# Patient Record
Sex: Female | Born: 1985 | Race: Black or African American | Hispanic: No | Smoking: Never smoker
Health system: Southern US, Community
[De-identification: ages and names within clinical notes are randomized; demographics above are authoritative.]

## PROBLEM LIST (undated history)

## (undated) HISTORY — PX: HERNIA REPAIR: SHX51

## (undated) HISTORY — PX: TUBAL LIGATION: SHX77

---
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
## 2007-11-28 ENCOUNTER — Emergency Department: Payer: Self-pay | Admitting: Emergency Medicine

## 2008-07-13 ENCOUNTER — Emergency Department: Payer: Self-pay | Admitting: Emergency Medicine

## 2010-02-08 ENCOUNTER — Emergency Department: Payer: Self-pay | Admitting: Emergency Medicine

## 2010-05-11 ENCOUNTER — Observation Stay: Payer: Self-pay

## 2010-06-06 ENCOUNTER — Observation Stay: Payer: Self-pay

## 2010-06-13 ENCOUNTER — Observation Stay: Payer: Self-pay | Admitting: Obstetrics and Gynecology

## 2010-06-20 ENCOUNTER — Observation Stay: Payer: Self-pay

## 2010-06-27 ENCOUNTER — Observation Stay: Payer: Self-pay | Admitting: Advanced Practice Midwife

## 2010-07-05 ENCOUNTER — Observation Stay: Payer: Self-pay | Admitting: Obstetrics and Gynecology

## 2010-07-07 ENCOUNTER — Inpatient Hospital Stay: Payer: Self-pay

## 2010-09-26 ENCOUNTER — Emergency Department: Payer: Self-pay | Admitting: Emergency Medicine

## 2011-12-17 LAB — HM PAP SMEAR: HM Pap smear: NEGATIVE

## 2012-07-01 ENCOUNTER — Inpatient Hospital Stay: Payer: Self-pay | Admitting: Obstetrics and Gynecology

## 2012-07-01 LAB — CBC WITH DIFFERENTIAL/PLATELET
Basophil #: 0 10*3/uL (ref 0.0–0.1)
Basophil %: 0.6 %
Eosinophil %: 0 %
HGB: 9.8 g/dL — ABNORMAL LOW (ref 12.0–16.0)
Lymphocyte #: 1.4 10*3/uL (ref 1.0–3.6)
Lymphocyte %: 18.2 %
Monocyte #: 0.4 x10 3/mm (ref 0.2–0.9)
Neutrophil #: 5.7 10*3/uL (ref 1.4–6.5)
Neutrophil %: 75.6 %
WBC: 7.5 10*3/uL (ref 3.6–11.0)

## 2012-07-01 LAB — COMPREHENSIVE METABOLIC PANEL
Albumin: 2.7 g/dL — ABNORMAL LOW (ref 3.4–5.0)
Alkaline Phosphatase: 324 U/L — ABNORMAL HIGH (ref 50–136)
Bilirubin,Total: 0.5 mg/dL (ref 0.2–1.0)
Calcium, Total: 8.2 mg/dL — ABNORMAL LOW (ref 8.5–10.1)
Co2: 26 mmol/L (ref 21–32)
EGFR (African American): >60
EGFR (Non-African Amer.): >60
Glucose: 56 mg/dL — ABNORMAL LOW (ref 65–99)
Potassium: 3.4 mmol/L — ABNORMAL LOW (ref 3.5–5.1)
SGOT(AST): 17 U/L (ref 15–37)
SGPT (ALT): 7 U/L — ABNORMAL LOW (ref 12–78)
Sodium: 138 mmol/L (ref 136–145)
Total Protein: 6.4 g/dL (ref 6.4–8.2)

## 2012-07-01 LAB — DRUG SCREEN, URINE
Barbiturates, Ur Screen: NEGATIVE (ref ?–200)
Cannabinoid 50 Ng, Ur ~~LOC~~: POSITIVE (ref ?–50)
Methadone, Ur Screen: NEGATIVE (ref ?–300)
Opiate, Ur Screen: NEGATIVE (ref ?–300)
Phencyclidine (PCP) Ur S: NEGATIVE (ref ?–25)

## 2014-01-07 ENCOUNTER — Observation Stay: Payer: Self-pay | Admitting: Obstetrics and Gynecology

## 2014-01-07 LAB — URINALYSIS, COMPLETE
Bacteria: NONE SEEN
Bilirubin,UR: NEGATIVE
Blood: NEGATIVE
GLUCOSE, UR: NEGATIVE mg/dL (ref 0–75)
Ketone: NEGATIVE
Leukocyte Esterase: NEGATIVE
NITRITE: NEGATIVE
Ph: 7 (ref 4.5–8.0)
Protein: NEGATIVE
RBC,UR: <1 /HPF (ref 0–5)
SPECIFIC GRAVITY: 1.005 (ref 1.003–1.030)
Squamous Epithelial: 7
WBC UR: NONE SEEN /HPF (ref 0–5)

## 2014-01-07 LAB — DRUG SCREEN, URINE
Amphetamines, Ur Screen: NEGATIVE (ref ?–1000)
BARBITURATES, UR SCREEN: NEGATIVE (ref ?–200)
Benzodiazepine, Ur Scrn: NEGATIVE (ref ?–200)
COCAINE METABOLITE, UR ~~LOC~~: NEGATIVE (ref ?–300)
Cannabinoid 50 Ng, Ur ~~LOC~~: POSITIVE (ref ?–50)
MDMA (Ecstasy)Ur Screen: NEGATIVE (ref ?–500)
Methadone, Ur Screen: NEGATIVE (ref ?–300)
OPIATE, UR SCREEN: NEGATIVE (ref ?–300)
Phencyclidine (PCP) Ur S: NEGATIVE (ref ?–25)
TRICYCLIC, UR SCREEN: NEGATIVE (ref ?–1000)

## 2014-01-07 LAB — FETAL FIBRONECTIN
APPEARANCE: NORMAL
Fetal Fibronectin: NEGATIVE

## 2014-01-08 LAB — GC/CHLAMYDIA PROBE AMP

## 2014-01-15 ENCOUNTER — Observation Stay: Payer: Self-pay | Admitting: Obstetrics & Gynecology

## 2014-01-15 LAB — URINALYSIS, COMPLETE
BILIRUBIN, UR: NEGATIVE
BLOOD: NEGATIVE
Bacteria: NONE SEEN
Glucose,UR: NEGATIVE mg/dL (ref 0–75)
Ketone: NEGATIVE
NITRITE: NEGATIVE
Ph: 6 (ref 4.5–8.0)
Protein: NEGATIVE
Specific Gravity: 1.017 (ref 1.003–1.030)
WBC UR: 7 /HPF (ref 0–5)

## 2014-03-12 ENCOUNTER — Ambulatory Visit
Admit: 2014-03-12 | Disposition: A | Discharge status: Home / Self Care | Payer: Self-pay | Admitting: Obstetrics & Gynecology

## 2014-03-24 ENCOUNTER — Inpatient Hospital Stay: Payer: Self-pay

## 2014-03-24 LAB — CBC WITH DIFFERENTIAL/PLATELET
BASOS PCT: 4.2 %
Basophil #: 0.3 10*3/uL — ABNORMAL HIGH (ref 0.0–0.1)
EOS PCT: 0.5 %
Eosinophil #: 0 10*3/uL (ref 0.0–0.7)
HCT: 30.3 % — ABNORMAL LOW (ref 35.0–47.0)
HGB: 9.5 g/dL — AB (ref 12.0–16.0)
LYMPHS ABS: 0.9 10*3/uL — AB (ref 1.0–3.6)
Lymphocyte %: 11.2 %
MCH: 29.6 pg (ref 26.0–34.0)
MCHC: 31.5 g/dL — ABNORMAL LOW (ref 32.0–36.0)
MCV: 94 fL (ref 80–100)
MONO ABS: 0.4 x10 3/mm (ref 0.2–0.9)
Monocyte %: 5.4 %
NEUTROS ABS: 6 10*3/uL (ref 1.4–6.5)
NEUTROS PCT: 78.7 %
Platelet: 453 10*3/uL — ABNORMAL HIGH (ref 150–440)
RBC: 3.22 10*6/uL — ABNORMAL LOW (ref 3.80–5.20)
RDW: 21.8 % — ABNORMAL HIGH (ref 11.5–14.5)
WBC: 7.6 10*3/uL (ref 3.6–11.0)

## 2014-03-25 LAB — HEMATOCRIT: HCT: 28.7 % — AB (ref 35.0–47.0)

## 2014-07-02 NOTE — Op Note (Signed)
PATIENT NAME:  Rachel Stevenson, Rachel Stevenson MR#:  [id number] DATE OF BIRTH:  03/14/1985  DATE OF PROCEDURE:  07/01/2012  PREOPERATIVE DIAGNOSES: 1.  Term intrauterine pregnancy at 37 weeks 4 days gestation.  2.  Laboring with fetus in breech position.  POSTOPERATIVE DIAGNOSES: 1.  Term intrauterine pregnancy at 37 weeks 4 days gestation.  2.  Laboring with fetus in breech position.   OPERATION PERFORMED:  Primary low transverse cesarean section via Pfannenstiel skin incision.   ANESTHESIA USED:  Spinal.   PRIMARY SURGEON:  Vena Austria, M.D.  ASSISTANT:  Burgess Estelle, MD  ESTIMATED BLOOD LOSS:  600 mL.   DRAINS OR TUBES:  Foley to gravity drainage, On-Q catheter system.   PREOPERATIVE ANTIBIOTICS:  2 grams of Ancef.   COMPLICATIONS:  None.   SPECIMENS REMOVED:  None.   INTRAOPERATIVE FINDINGS:  Normal uterus, tubes, and ovaries.  Fetus in the footling breech position.  Birth weight 2500 grams, Apgars 7 and 8.   THE PATIENT'S CONDITION FOLLOWING PROCEDURE:  Stable.   PROCEDURE IN DETAIL:  Risks, benefits, and alternatives of the procedure were discussed with patient prior to proceeding to the operating room.  The attempts were made to halt the patient's labor with IV hydration and terbutaline.  The patient made change from 2 to 4 cm during her stay on labor and delivery.  A mutual decision was made to proceed with operative delivery.  The patient was taken to the operating room where spinal anesthesia was administered.  The patient was positioned in the supine position, prepped and draped in the usual sterile fashion.  Prior to beginning of the case timeout was performed and local anesthetic was checked and noted to be adequate.  A Pfannenstiel skin incision was made 2 cm above the pubic symphysis, carried down sharply to the level of the rectus fascia using the knife and the fascia was incised in the midline using the knife.  The fascial incision was then extended using Mayo  scissors.  The superior border of the rectus fascia was grasped with 2 Coker clamps.  The underlying rectus muscles were then dissected off the fascia bluntly.  The median raphe was incised using Mayo scissors.  The inferior border of the rectus fascia was dissected off the muscle in a similar fashion.  The midline was identified.  The peritoneum was entered bluntly.  The peritoneal incision was then extended using manual traction.  A bladder blade was placed and a bladder flap was created using Metzenbaum scissors and developed digitally.  The bladder blade was then replaced displacing the bladder caudad.  A low transverse incision was then made on the uterus and the hysterotomy was entered bluntly using the operator's finger and then extended using manual traction.  Upon placing the operator's hand into the incision, the fetus was noted to be in the footling breech position.  Both feet were identified, grasped, brought to the incision, and delivered atraumatically.  The fetus was then delivered to the level of the scapula.  The right arm was braced and then delivered the fetus was rotated 180 degrees and the left arm was delivered in a similar fashion.  The head was then flexed using a Mauriceau-Smellie-Veit maneuver and delivered atraumatically using fundal pressure.  The infant was suctioned, cord was clamped and cut and the infant was passed to the awaiting pediatrician.  Cord blood was obtained.  The placenta was delivered using manual extraction.  The uterus was then exteriorized, wiped clean of clots and debris.  The hysterotomy was repaired using a two layer closure of 0 Vicryl with the first being a running locked, the second a vertical imbricating.  The uterus returned to the abdomen.  The peritoneal gutters were wiped clean of clots and debris using two moist laps.  The hysterotomy incision was reinspected and noted to be hemostatic.  The On-Q catheter system was then placed subfascially per the usual  protocol.  The fascia was closed using a looped #1 PDS in a running fashion.  Following fascial closure, the subcutaneous tissue was irrigated.  Hemostasis was achieved using the Bovie.  Skin was closed using Insorb staples.  Each On-Q catheter was then bolused with 5 mL of 0.5% bupivacaine each and dressed with Dermabond.  Sponge, needle, and instrument counts were correct x 2.  The patient was taken to the recovery room in stable condition.      ____________________________ Florina OuAndreas M. Bonney AidStaebler, MD ams:ea D: 07/02/2012 01:30:26 ET T: 07/02/2012 02:48:20 ET JOB#: [id number]  cc: Florina OuAndreas M. Bonney AidStaebler, MD, <Dictator> Lorrene ReidANDREAS M Messi Twedt MD ELECTRONICALLY SIGNED 07/08/2012 14:18

## 2014-07-05 LAB — SURGICAL PATHOLOGY

## 2014-07-11 NOTE — Op Note (Signed)
PATIENT NAME:  Rachel Stevenson, Rachel Stevenson MR#:  [id number] DATE OF BIRTH:  1986/02/28  DATE OF PROCEDURE:  03/24/2014  PREOPERATIVE DIAGNOSES:  1. Intrauterine pregnancy at 35 weeks 6 days gestational age.  2. Preterm labor.  3. Breech presentation.  4. Desire for permanent sterility.  5. History of prior cesarean section, desires repeat.   POSTOPERATIVE DIAGNOSES: 1. Intrauterine pregnancy at 35 weeks 6 days gestational age.  2. Preterm labor.  3. Breech presentation.  4. Desire for permanent sterility.  5. History of prior cesarean section, desires repeat.   PROCEDURE: 1. Repeat low transverse cesarean section via Pfannenstiel incision.  2. Bilateral tubal ligation via Pomeroy method.   ANESTHESIA: Spinal.   SURGEON: Conard Novak, MD.  ASSISTANT SURGEON:  Jannet Mantis, CNM.  ESTIMATED BLOOD LOSS: 850 mL.   OPERATIVE FLUIDS: 900 mL.   COMPLICATIONS: None.   URINE OUTPUT: 100 mL of clear urine.   FINDINGS: Normal appearing gravid uterus, fallopian tubes and ovaries.   SPECIMENS: Portion of right and left fallopian tubes.   CONDITION AT THE END OF THE PROCEDURE:  Stable.   PROCEDURE IN DETAIL: The patient was taken to the Operating Room where regional anesthesia was administered and was found to be adequate. The patient was placed in the dorsal supine position with a leftward tilt and prepped and draped in the usual sterile fashion.   A timeout was called.   A Pfannenstiel incision was made with a scalpel and carried through the various layers until the peritoneum was identified and entered sharply. The peritoneal opening was extended and a bladder flap was created and a bladder retractor was used to pull the bladder out of the operative area of interest. A low transverse hysterotomy was made with a scalpel and extended laterally with cranial and caudal tension. The fetal breech was grasped and delivered via the Mauriceau-Smellie-Veit technique using a moistened blue towel  on the lower back area. The cord was clamped and cut and the infant was handed off to the pediatrician. Cord blood was collected. The placenta delivered spontaneously intact with a 3 vessel cord. The uterus was cleared of all clots and debris after being exteriorized. The hysterotomy was closed using #0 Vicryl in a running locked fashion. Hemostasis was noted at this point.   Attention was turned to the left fallopian tube where in the mid isthmic region an approximately 3 cm portion of fallopian tube was suture ligated using two #0 plain guts using the Pomeroy method after verification that this indeed, was a tube by following the tube out to the fimbriated end. After removal of a segment of tube, hemostasis was verified. The same procedure was carried out on the right fallopian tube and hemostasis was again noted. The hysterotomy was then reinspected with the uterus exteriorized and found to be hemostatic. The uterus was returned to the abdomen and the abdomen was cleared of all clots and debris. The hysterotomy was again checked and verified to be hemostatic. The peritoneum was reapproximated using #0 Vicryl in a running fashion.   The On-Q catheters were placed according to the manufacturer's recommendations. They were inserted approximately 4 cm cephalad to the incision line, inserted to a position superficial to the rectus abdominis muscles and deep to the rectus fascia. They were inserted to a depth of approximately the third marking on the catheters.   The fascia was closed using #0 Vicryl in a running fashion using 2 sutures each starting at the lateral apices and meeting in  the middle where they were tied together. The subcutaneous tissue was reapproximated just below the dermal layer to relieve tension on the skin closure. The skin was closed using 4-0 Monocryl in a subcuticular fashion. The skin closure was reinforced using benzoin and Steri-Strips.   The On-Q catheters were affixed to the skin  using Dermabond as well as Steri-Strips and Tegaderm. Each catheter was bolused with 5 mL of 0.5% Marcaine plain for a total of 10 mL.    The patient tolerated the procedure well. Sponge, lap and needle counts were correct x2. The patient received Ancef 2 grams prior to skin incision. The patient was taken to the recovery room in stable condition.    ____________________________ Conard NovakStephen D. Kynlea Blackston, MD sdj:by D: 03/24/2014 20:27:23 ET T: 03/24/2014 21:07:37 ET JOB#: [id number]  cc: Conard NovakStephen D. Ahtziri Jeffries, MD, <Dictator> Conard NovakSTEPHEN D Maudean Hoffmann MD ELECTRONICALLY SIGNED 03/25/2014 13:14

## 2014-07-20 NOTE — H&P (Signed)
L&D Evaluation:  History:  HPI 37 yer old G2 P1001 with EDC=07/22/2012 by 10wk 2 day ultrasound presents with c/o nausea/vomiting x 1 week with "blood" in vomitus and onset contractions last night. Has had vomiting throughout pregnancy and blood in vomitus but has gained 30# with pregnancy. Also c/o headache this AM and substrenal burning sensation. Denies use of any medications for nausea or heartburn. Last ate at 9 PM. Able to keep down juice on the way to hospital.  Contractions that started last nite became more frequent and regular on arrival to L&D. Denies LOF or VB. Denies nasal congestion. Baby active. Hx of SVD in 2012 of a 5#9oz female.   Presents with contractions, nausea/vomiting   Patient's Medical History abnormal Paps   Patient's Surgical History colpo 2008   Medications Pre Natal Vitamins   Allergies NKDA   Social History denies use of tobacco, alcohol, or illicit street drugs   Family History Non-Contributory   Exam:  Vital Signs stable   Urine Protein not completed   General no apparent distress   Mental Status clear   Chest clear   Heart normal sinus rhythm, no murmur/gallop/rubs   Abdomen gravid, tender with contractions   Estimated Fetal Weight Small for gestational age   Fetal Position incomplete breech   Edema no edema   Reflexes 1+   Pelvic 2-3/75%/OOP   Mebranes Intact, AFI=4.28 + 6.03 + 4.3 + 2.2 cm=16.81 cm.  Placenta fundal   FHT normal rate with no decels, Cat 1   Ucx q2-3 min   Skin dry   Impression:  Impression IUP at 37 weeks with hematochezia and contractions.  Breech presentation. R/O labor   Plan:  Plan EFM/NST, monitor contractions and for cervical change, IV bolus, IV phenergan, and IV Protonix. NPO except ice chips. Met c, UDS with routine intrapartum labs. Discussed with patient  breech presentation and increased risk to baby with vaginal delivery  of head entrapment.  Explined that she would need a C-section to deliver  baby if labor progresses. Dr Georgianne Fick notified of patient and has talked with patient.   Electronic Signatures: Rachel Stevenson (CNM)  (Signed 22-Apr-14 12:20)  Authored: L&D Evaluation   Last Updated: 22-Apr-14 12:20 by Rachel Stevenson (CNM)
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
## 2016-03-08 IMAGING — US US OB US >=[ID] SNGL FETUS
1 series · 14 of 28 positions shown · non-contrast
Comparison: none

CLINICAL DATA: Pregnancy evaluation.

EXAM:
ULTRASOUND OB >=EO7FN SINGLE FETUS

[Series 1: us ob us >=(id) sngl fetus · 0.18mm/px · 14 of 81 slices shown]
[im 3/81]
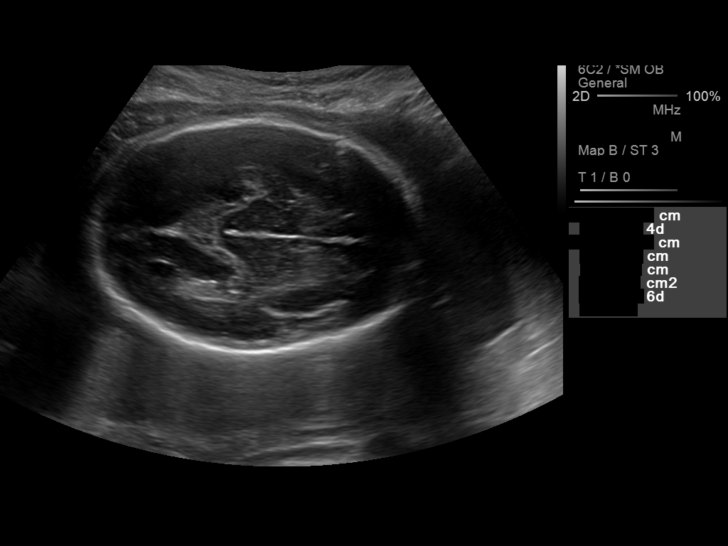
[im 9/81]
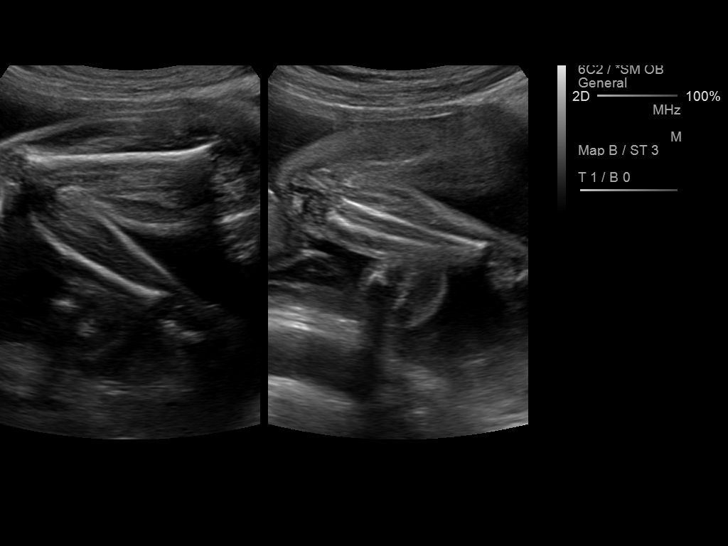
[im 15/81]
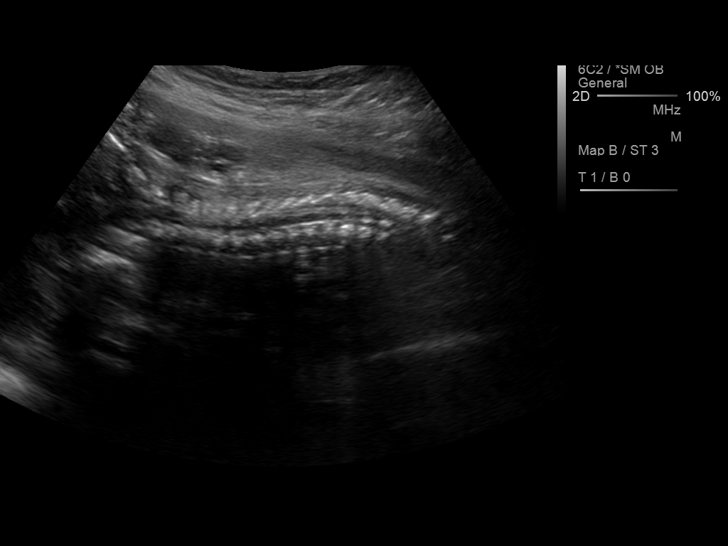
[im 21/81]
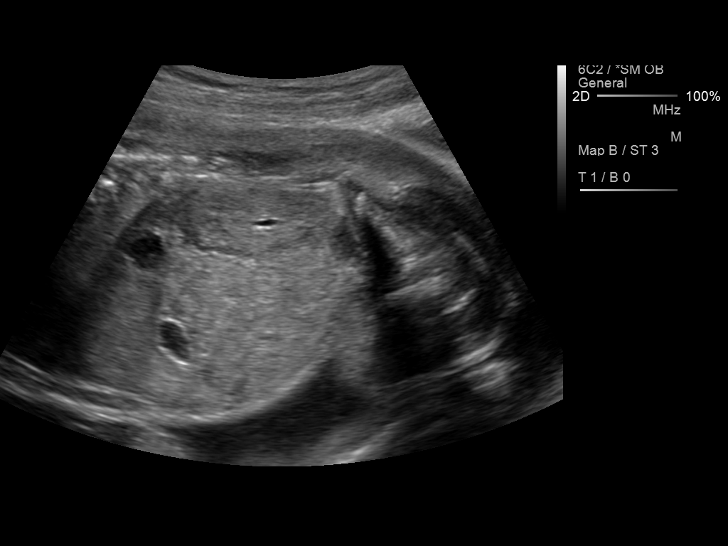
[im 27/81]
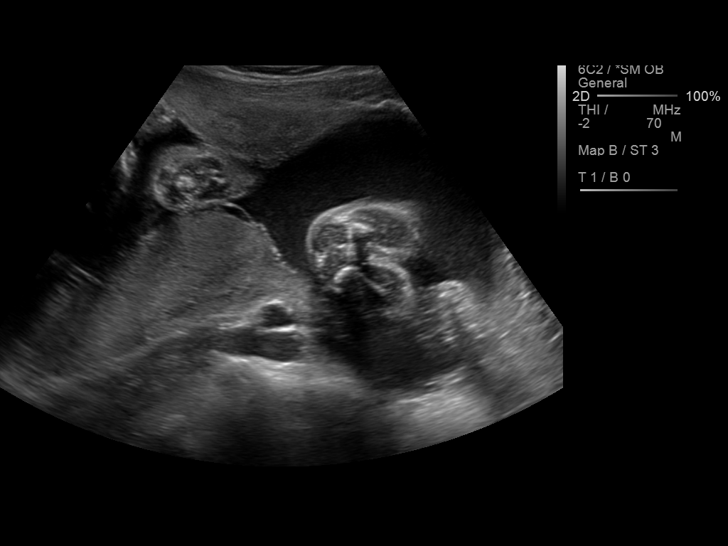
[im 33/81]
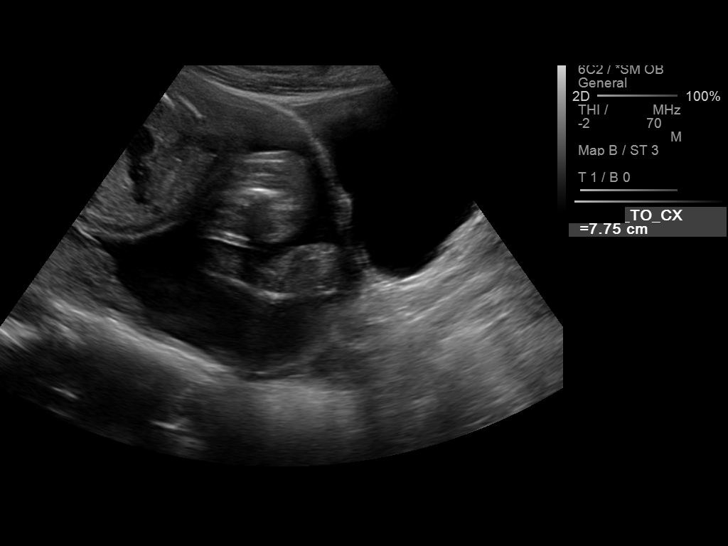
[im 39/81]
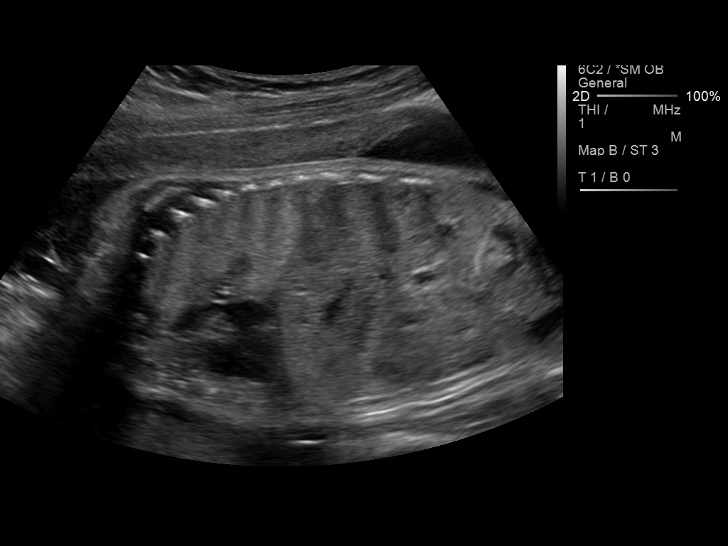
[im 45/81]
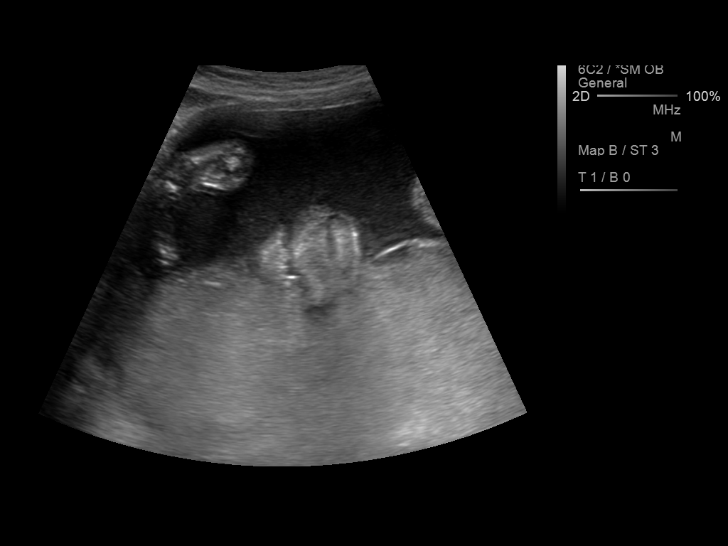
[im 51/81]
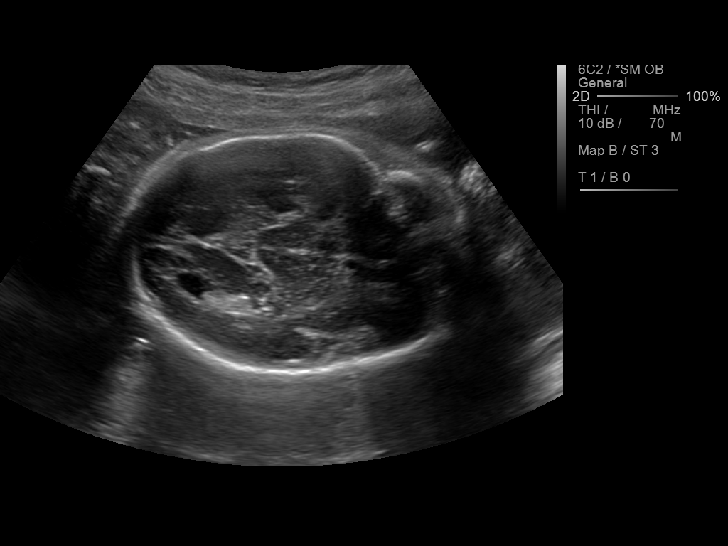
[im 57/81]
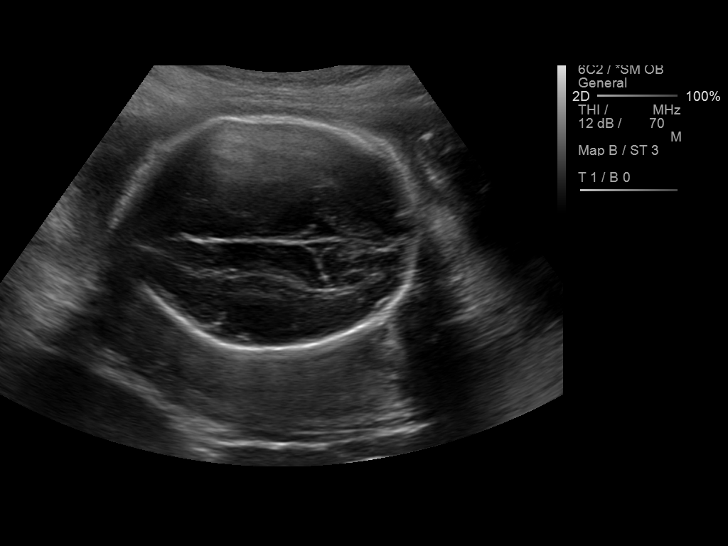
[im 63/81]
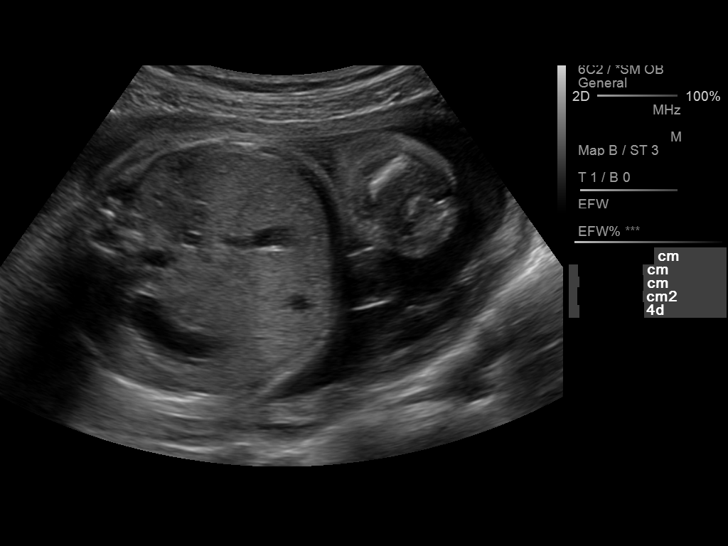
[im 69/81]
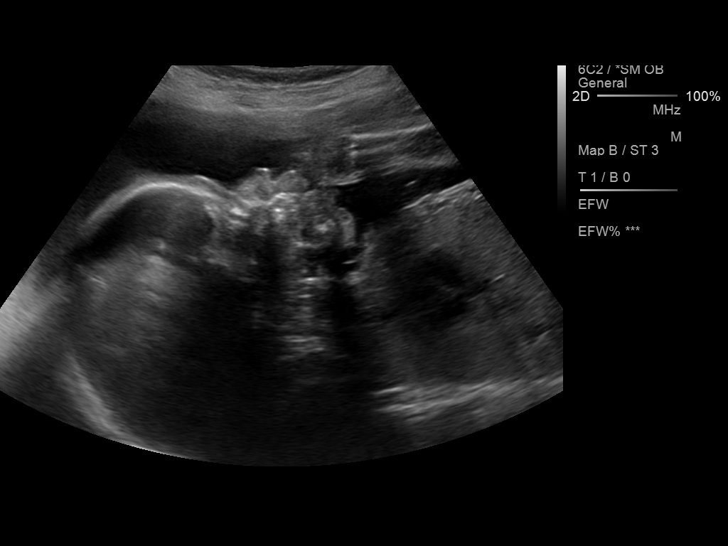
[im 75/81]
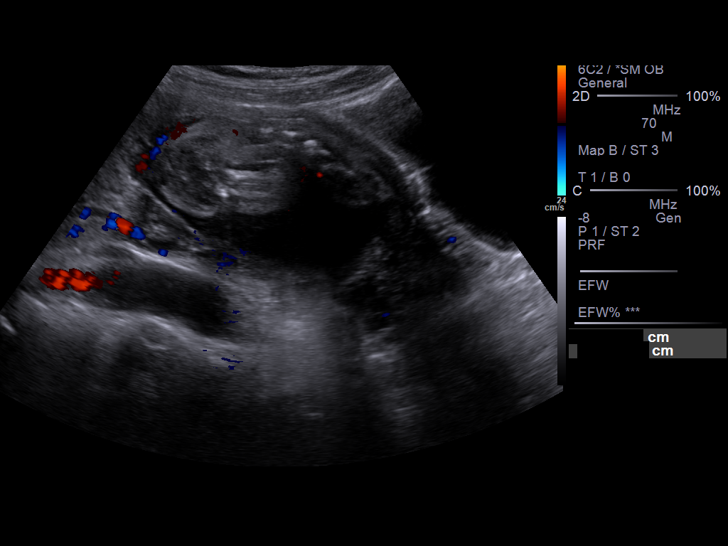
[im 81/81]
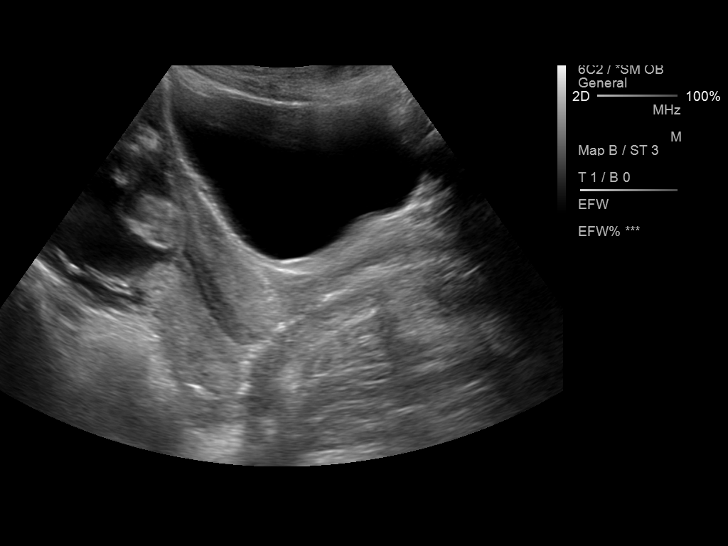

[14 of 28 positions shown; findings below may reference images not displayed]

FINDINGS: Number of Fetuses: 1

Heart Rate:  143 bpm

Movement: Present.

Presentation: Breech

Previa: No

Placental Location: Posterior

Amniotic Fluid (Subjective): Normal

Amniotic Fluid (Objective):

AFI 14.2 cm

FETAL BIOMETRY

BPD:  6.1cm 24w 5d

HC:    22.6cm  24w   5d

AC:   20.5cm  25w   1d

FL:   4.8cm  25w   6d

Current Mean GA: 25w 0d

FETAL ANATOMY

Fetal anatomy is best evaluated at a tertiary care or high risk
Center, particularly if the patient has risk factors for this
pregnancy.

Lateral Ventricles: Visualized.

Thalami/CSP: Visualized.

Posterior Fossa:  Visualized.

Nuchal Region: Not visualized.

Spine: Visualized.

Upper Lip: Visualized.

4 Chamber Heart on Left: Visualized.

Stomach on Left: Visualized.

3 Vessel Cord: Visualized.

Cord Insertion site: Visualized.

Kidneys: Visualized.

Bladder: Visualized.

Extremities: Visualized.

Maternal Findings:

Cervix:  3.2  cm  closed.
IMPRESSION: Single viable intrauterine pregnancy at 25 weeks 0 days. Fetus is in
breech presentation.

## 2016-04-05 DIAGNOSIS — B977 Papillomavirus as the cause of diseases classified elsewhere: Secondary | ICD-10-CM | POA: Insufficient documentation

## 2016-07-17 LAB — HM HIV SCREENING LAB: HM HIV Screening: NEGATIVE

## 2016-11-01 ENCOUNTER — Emergency Department
Admission: EM | Admit: 2016-11-01 | Discharge: 2016-11-01 | Disposition: A | Discharge status: Home / Self Care | Payer: Self-pay | Attending: Emergency Medicine | Admitting: Emergency Medicine

## 2016-11-01 DIAGNOSIS — W503XXA Accidental bite by another person, initial encounter: Secondary | ICD-10-CM | POA: Insufficient documentation

## 2016-11-01 DIAGNOSIS — Y9383 Activity, rough housing and horseplay: Secondary | ICD-10-CM | POA: Insufficient documentation

## 2016-11-01 DIAGNOSIS — S61259A Open bite of unspecified finger without damage to nail, initial encounter: Secondary | ICD-10-CM

## 2016-11-01 DIAGNOSIS — Y929 Unspecified place or not applicable: Secondary | ICD-10-CM | POA: Insufficient documentation

## 2016-11-01 DIAGNOSIS — Z23 Encounter for immunization: Secondary | ICD-10-CM | POA: Insufficient documentation

## 2016-11-01 DIAGNOSIS — Y999 Unspecified external cause status: Secondary | ICD-10-CM | POA: Insufficient documentation

## 2016-11-01 DIAGNOSIS — S61256A Open bite of right little finger without damage to nail, initial encounter: Secondary | ICD-10-CM | POA: Insufficient documentation

## 2016-11-01 MED ORDER — AMOXICILLIN-POT CLAVULANATE 875-125 MG PO TABS
1.0000 | ORAL_TABLET | Freq: Once | ORAL | Status: AC
  Administered 2016-11-01: 1 via ORAL
  Filled 2016-11-01: qty 1

## 2016-11-01 MED ORDER — TRAMADOL HCL 50 MG PO TABS: 50.0000 mg | ORAL_TABLET | Freq: Four times a day (QID) | ORAL | 0 refills | Status: AC | PRN

## 2016-11-01 MED ORDER — TETANUS-DIPHTHERIA TOXOIDS TD 5-2 LFU IM INJ
0.5000 mL | INJECTION | Freq: Once | INTRAMUSCULAR | Status: AC
  Administered 2016-11-01: 0.5 mL via INTRAMUSCULAR
  Filled 2016-11-01: qty 0.5

## 2016-11-01 MED ORDER — IBUPROFEN 600 MG PO TABS
600.0000 mg | ORAL_TABLET | Freq: Once | ORAL | Status: AC
  Administered 2016-11-01: 600 mg via ORAL
  Filled 2016-11-01: qty 1

## 2016-11-01 MED ORDER — IBUPROFEN 600 MG PO TABS: 600.0000 mg | ORAL_TABLET | Freq: Four times a day (QID) | ORAL | 0 refills | Status: DC | PRN

## 2016-11-01 MED ORDER — TRAMADOL HCL 50 MG PO TABS
50.0000 mg | ORAL_TABLET | Freq: Once | ORAL | Status: AC
  Administered 2016-11-01: 50 mg via ORAL
  Filled 2016-11-01: qty 1

## 2016-11-01 MED ORDER — AMOXICILLIN-POT CLAVULANATE 875-125 MG PO TABS: 1.0000 | ORAL_TABLET | Freq: Two times a day (BID) | ORAL | 0 refills | Status: DC
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
## 2016-11-01 NOTE — Discharge Instructions (Signed)
Final discharge care instruction take medication as directed.
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
## 2018-04-03 ENCOUNTER — Emergency Department
Admission: EM | Admit: 2018-04-03 | Discharge: 2018-04-03 | Disposition: A | Discharge status: Home / Self Care | Payer: Self-pay | Attending: Emergency Medicine | Admitting: Emergency Medicine

## 2018-04-03 ENCOUNTER — Emergency Department: Payer: Self-pay

## 2018-04-03 ENCOUNTER — Other Ambulatory Visit: Payer: Self-pay

## 2018-04-03 DIAGNOSIS — Y9389 Activity, other specified: Secondary | ICD-10-CM | POA: Insufficient documentation

## 2018-04-03 DIAGNOSIS — S93401A Sprain of unspecified ligament of right ankle, initial encounter: Secondary | ICD-10-CM | POA: Insufficient documentation

## 2018-04-03 DIAGNOSIS — Y999 Unspecified external cause status: Secondary | ICD-10-CM | POA: Insufficient documentation

## 2018-04-03 DIAGNOSIS — X500XXA Overexertion from strenuous movement or load, initial encounter: Secondary | ICD-10-CM | POA: Insufficient documentation

## 2018-04-03 DIAGNOSIS — Y929 Unspecified place or not applicable: Secondary | ICD-10-CM | POA: Insufficient documentation

## 2018-04-03 MED ORDER — NAPROXEN 500 MG PO TABS
500.0000 mg | ORAL_TABLET | Freq: Once | ORAL | Status: AC
  Administered 2018-04-03: 500 mg via ORAL
  Filled 2018-04-03: qty 1

## 2018-04-03 MED ORDER — NAPROXEN 500 MG PO TABS: 500.0000 mg | ORAL_TABLET | Freq: Two times a day (BID) | ORAL | Status: DC

## 2018-04-03 NOTE — ED Provider Notes (Signed)
[facility] Emergency Department Provider Note   ____________________________________________   First MD Initiated Contact with Patient 04/03/18 [phone]     (approximate)  I have reviewed the triage vital signs and the nursing notes.   HISTORY  Chief Complaint Ankle Pain    HPI Rachel Stevenson is a 33 y.o. female patient presents with right ankle pain secondary to sprain.  Patient fell and twisted her ankle prior to arrival.  Patient rates pain as a 9/10.  Patient unable to bear weight.  Patient described pain as "achy".  No palliative measures for complaint.  History reviewed. No pertinent past medical history.  There are no active problems to display for this patient.   Past Surgical History:  Procedure Laterality Date  . HERNIA REPAIR      Prior to Admission medications   Medication Sig Start Date End Date Taking? Authorizing Provider  amoxicillin-clavulanate (AUGMENTIN) 875-125 MG tablet Take 1 tablet by mouth 2 (two) times daily. 11/01/16   Joni ReiningSmith, Ronald K, PA-C  ibuprofen (ADVIL,MOTRIN) 600 MG tablet Take 1 tablet (600 mg total) by mouth every 6 (six) hours as needed. 11/01/16   Joni ReiningSmith, Ronald K, PA-C  naproxen (NAPROSYN) 500 MG tablet Take 1 tablet (500 mg total) by mouth 2 (two) times daily with a meal. 04/03/18   Joni ReiningSmith, Ronald K, PA-C    Allergies Patient has no known allergies.  No family history on file.  Social History Social History   Tobacco Use  . Smoking status: Never Smoker  . Smokeless tobacco: Never Used  Substance Use Topics  . Alcohol use: No  . Drug use: No    Review of Systems Constitutional: No fever/chills Eyes: No visual changes. ENT: No sore throat. Cardiovascular: Denies chest pain. Respiratory: Denies shortness of breath. Gastrointestinal: No abdominal pain.  No nausea, no vomiting.  No diarrhea.  No constipation. Genitourinary: Negative for dysuria. Musculoskeletal: Right ankle pain. Skin: Negative for  rash. Neurological: Negative for headaches, focal weakness or numbness.   ____________________________________________   PHYSICAL EXAM:  VITAL SIGNS: ED Triage Vitals  Enc Vitals Group     BP 04/03/18 0938 106/72     Pulse --      Resp 04/03/18 0938 15     Temp 04/03/18 0938 98.1 F (36.7 C)     Temp Source 04/03/18 0938 Oral     SpO2 04/03/18 0938 100 %     Weight 04/03/18 0938 125 lb (56.7 kg)     Height 04/03/18 0938 5\' 5"  (1.651 m)     Head Circumference --      Peak Flow --      Pain Score 04/03/18 0943 9     Pain Loc --      Pain Edu? --      Excl. in GC? --    Constitutional: Alert and oriented. Well appearing and in no acute distress. Hematological/Lymphatic/Immunilogical: No cervical lymphadenopathy. Cardiovascular: Normal rate, regular rhythm. Grossly normal heart sounds.  Good peripheral circulation. Respiratory: Normal respiratory effort.  No retractions. Lungs CTAB. Musculoskeletal: No lower extremity tenderness nor edema.  No joint effusions. Neurologic:  Normal speech and language. No gross focal neurologic deficits are appreciated. No gait instability. Skin:  Skin is warm, dry and intact. No rash noted. Psychiatric: Mood and affect are normal. Speech and behavior are normal.  ____________________________________________   LABS (all labs ordered are listed, but only abnormal results are displayed)  Labs Reviewed - No data to display ____________________________________________  EKG  ____________________________________________  RADIOLOGY  ED MD interpretation:    Official radiology report(s): Dg Ankle Complete Right  Result Date: 04/03/2018 CLINICAL DATA:  Pain following twisting injury EXAM: RIGHT ANKLE - COMPLETE 3+ VIEW COMPARISON:  None. FINDINGS: Frontal, oblique, and lateral views were obtained. There is mild soft tissue swelling laterally. There is no evident fracture. There is a focal joint effusion. There is no appreciable joint space  narrowing or erosion. The ankle mortise appears intact. IMPRESSION: Soft tissue swelling laterally with joint effusion. Suspect underlying ligamentous injury. No fracture evident. No appreciable arthropathy. Ankle mortise appears intact. Electronically Signed   By: Bretta BangWilliam  Woodruff III M.D.   On: 04/03/2018 10:17    ____________________________________________   PROCEDURES  Procedure(s) performed: None  Procedures  Critical Care performed: No  ____________________________________________   INITIAL IMPRESSION / ASSESSMENT AND PLAN / ED COURSE  As part of my medical decision making, I reviewed the following data within the electronic MEDICAL RECORD NUMBER    Patient presented running a pain secondary to a twisting fall.  Discussed x-ray findings with patient.  Patient placed in ankle splint given crutches assist with ambulation.  Patient given a work note advised follow-up PCP if no improvement in 3 to 4 days.      ____________________________________________   FINAL CLINICAL IMPRESSION(S) / ED DIAGNOSES  Final diagnoses:  Sprain of right ankle, unspecified ligament, initial encounter     ED Discharge Orders         Ordered    naproxen (NAPROSYN) 500 MG tablet  2 times daily with meals     04/03/18 1040           Note:  This document was prepared using Dragon voice recognition software and may include unintentional dictation errors.    Joni ReiningSmith, Ronald K, PA-C 04/03/18 1046    Rockne MenghiniNorman, Anne-Caroline, MD 04/03/18 1539

## 2018-04-03 NOTE — ED Triage Notes (Signed)
Pt c/o right ankle - she was outside and fell - pt reports that she is unable to bear any weight on the ankle

## 2018-04-03 NOTE — ED Notes (Signed)
See triage note  States she fell yesterday  Having pain to right ankle  States she was running and twisted ankle  Min swelling noted  Good pulses

## 2019-01-26 DIAGNOSIS — B977 Papillomavirus as the cause of diseases classified elsewhere: Secondary | ICD-10-CM

## 2019-01-27 ENCOUNTER — Other Ambulatory Visit: Payer: Self-pay

## 2019-01-27 ENCOUNTER — Encounter: Payer: Self-pay | Admitting: Physician Assistant

## 2019-01-27 ENCOUNTER — Ambulatory Visit: Payer: Medicaid Other | Admitting: Physician Assistant

## 2019-01-27 DIAGNOSIS — Z113 Encounter for screening for infections with a predominantly sexual mode of transmission: Secondary | ICD-10-CM | POA: Diagnosis not present

## 2019-01-27 DIAGNOSIS — N760 Acute vaginitis: Secondary | ICD-10-CM | POA: Diagnosis not present

## 2019-01-27 DIAGNOSIS — B9689 Other specified bacterial agents as the cause of diseases classified elsewhere: Secondary | ICD-10-CM

## 2019-01-27 LAB — WET PREP FOR TRICH, YEAST, CLUE
Trichomonas Exam: NEGATIVE
Yeast Exam: NEGATIVE

## 2019-01-27 MED ORDER — METRONIDAZOLE 500 MG PO TABS: 500.0000 mg | ORAL_TABLET | Freq: Two times a day (BID) | ORAL | 0 refills | Status: AC

## 2019-01-27 NOTE — Progress Notes (Signed)
    STI clinic/screening visit  Subjective:  Rachel Stevenson is a 33 y.o. female being seen today for an STI screening visit. The patient reports they do have symptoms.  Patient has the following medical conditions:   Patient Active Problem List   Diagnosis Date Noted  . 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 SEXUALLY TRANSMITTED DISEASE    HPI  Patient reports that she thinks that she may have yeast due to having lower back pains.  LMP 12/22/2018 and is s/p BTL.  Denies other symptoms.  See flowsheet for further details and programmatic requirements.    The following portions of the patient's history were reviewed and updated as appropriate: allergies, current medications, past medical history, past social history, past surgical history and problem list.  Objective:  There were no vitals filed for this visit.  Physical Exam Constitutional:      General: She is not in acute distress.    Appearance: Normal appearance.  HENT:     Head: Normocephalic and atraumatic.     Mouth/Throat:     Mouth: Mucous membranes are moist.     Pharynx: Oropharynx is clear. No oropharyngeal exudate or posterior oropharyngeal erythema.  Eyes:     Conjunctiva/sclera: Conjunctivae normal.  Pulmonary:     Effort: Pulmonary effort is normal.  Abdominal:     Palpations: Abdomen is soft. There is no mass.     Tenderness: There is no abdominal tenderness. There is no guarding or rebound.  Genitourinary:    General: Normal vulva.     Rectum: Normal.     Comments: External genitalia/pubic area without nits, lice, edema, erythema, lesions and inguinal adenopathy. Vagina with normal mucosa and moderate amount of pinkish/bloody  Discharge. Cervix without visible lesions. Uterus firm, mobile, nt, no masses, no CMT, no adnexal tenderness or fullness. Skin:    General: Skin is warm and dry.     Findings: No bruising, erythema, lesion or rash.  Neurological:      Mental Status: She is alert and oriented to person, place, and time.  Psychiatric:        Mood and Affect: Mood normal.        Behavior: Behavior normal.        Thought Content: Thought content normal.        Judgment: Judgment normal.       Assessment and Plan:  Rachel Stevenson is a 33 y.o. female presenting to the [facility] Department for STI screening  1. Screening for STD (sexually transmitted disease) Patient into clinic with symptoms. Rec condoms with all sex. Await test results.  Counseled that RN will call if needs to RTC for further treatment once results are back.  - WET PREP FOR [place], YEAST, CLUE - Chlamydia/Gonorrhea [facility] - HIV [place] [facility] - Syphilis Serology, [facility]  2. BV (bacterial vaginosis) Will treat with Metronidazole 500mg  #14 1 po BID for 7 days with food, no EtOH for 24 hr before and until 72 hr after completing medicine. No sex for 7days. Enc to use OTC antifungal cream if has itching during or just after completing antibiotic. - metroNIDAZOLE (FLAGYL) 500 MG tablet; Take 1 tablet (500 mg total) by mouth 2 (two) times daily for 7 days.  Dispense: 14 tablet; Refill: 0     No follow-ups on file.  No future appointments.  Jerene Dilling, PA

## 2019-05-17 NOTE — Progress Notes (Signed)
Chart reviewed by Pharmacist  Suzanne Walker PharmD, Contract Pharmacist at [facility]  

## 2019-08-04 ENCOUNTER — Encounter: Payer: Self-pay | Admitting: Emergency Medicine

## 2019-08-04 ENCOUNTER — Emergency Department
Admission: EM | Admit: 2019-08-04 | Discharge: 2019-08-04 | Disposition: A | Discharge status: Home / Self Care | Payer: Medicaid Other | Attending: Emergency Medicine | Admitting: Emergency Medicine

## 2019-08-04 ENCOUNTER — Other Ambulatory Visit: Payer: Self-pay

## 2019-08-04 DIAGNOSIS — K0889 Other specified disorders of teeth and supporting structures: Secondary | ICD-10-CM | POA: Diagnosis not present

## 2019-08-04 MED ORDER — OXYCODONE-ACETAMINOPHEN 5-325 MG PO TABS
1.0000 | ORAL_TABLET | Freq: Once | ORAL | Status: AC
  Administered 2019-08-04: 1 via ORAL
  Filled 2019-08-04: qty 1

## 2019-08-04 MED ORDER — LIDOCAINE VISCOUS HCL 2 % MT SOLN
15.0000 mL | Freq: Once | OROMUCOSAL | Status: AC
  Administered 2019-08-04: 15 mL via OROMUCOSAL
  Filled 2019-08-04: qty 15

## 2019-08-04 MED ORDER — IBUPROFEN 600 MG PO TABS
600.0000 mg | ORAL_TABLET | Freq: Once | ORAL | Status: AC
  Administered 2019-08-04: 600 mg via ORAL
  Filled 2019-08-04: qty 1

## 2019-08-04 NOTE — ED Triage Notes (Signed)
Pt reports pain to her right side wisdom teeth. Pt reports went to [facility] but was told to come to the ED.

## 2019-08-04 NOTE — Discharge Instructions (Signed)
Follow-up with you into see Ortho treating dentist of your choice for t tooth extraction tooth.

## 2019-08-04 NOTE — ED Provider Notes (Signed)
[facility] Emergency Department Provider Note   ____________________________________________   First MD Initiated Contact with Patient 08/04/19 1428     (approximate)  I have reviewed the triage vital signs and the nursing notes.   HISTORY  Chief Complaint Dental Pain    HPI Rachel Stevenson is a 34 y.o. female patient presents to the ED stating she was sent here for tooth extraction.  Patient states she left her oral surgical clinic just prior to her arrival here.  Patient shows a consult sheet test states please evaluate and extract tooth.  Advised patient we do not do teeth extractions at this facility.  Patient is very upset that we cannot pull the tooth.  I decided to call the dental clinic that issued the the initial consult and found out that the patient was supposed to go to [facility] dental clinic.    I told the patient I must misunderstood and will treat her pain.  Mother is with the patient and stated they have  obtain the name of another dentist so did not have to drive to [facility] but will go to [place], [place] dental clinic     History reviewed. No pertinent past medical history.  Patient Active Problem List   Diagnosis Date Noted  . 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 HERNIA REPAIR    . TUBAL LIGATION      Prior to Admission medications   Medication Sig Start Date End Date Taking? Authorizing Provider  cefdinir (OMNICEF) 300 MG capsule Take 300 mg by mouth 2 (two) times daily.   Yes [provider]  HYDROcodone-acetaminophen (NORCO/VICODIN) 5-325 MG tablet Take 1 tablet by mouth every 6 (six) hours as needed for moderate pain.   Yes [provider]    Allergies Patient has no known allergies.  Family History  Problem Relation Age of Onset  . Aneurysm Maternal Grandmother   . Diabetes Maternal Grandfather     Social History Social History   Tobacco Use  . Smoking  status: Never Smoker  . Smokeless tobacco: Never Used  Substance Use Topics  . Alcohol use: No  . Drug use: No    Review of Systems Constitutional: No fever/chills Eyes: No visual changes. ENT: No sore throat.  Dental pain Cardiovascular: Denies chest pain. Respiratory: Denies shortness of breath. Gastrointestinal: No abdominal pain.  No nausea, no vomiting.  No diarrhea.  No constipation. Genitourinary: Negative for dysuria. Musculoskeletal: Negative for back pain. Skin: Negative for rash. Neurological: Negative for headaches, focal weakness or numbness.   ____________________________________________   PHYSICAL EXAM:  VITAL SIGNS: ED Triage Vitals  Enc Vitals Group     BP 08/04/19 1345 139/81     Pulse Rate 08/04/19 1345 88     Resp --      Temp 08/04/19 1345 98.4 F (36.9 C)     Temp Source 08/04/19 1345 Oral     SpO2 08/04/19 1345 99 %     Weight 08/04/19 1335 125 lb (56.7 kg)     Height 08/04/19 1335 5\' 5"  (1.651 m)     Head Circumference --      Peak Flow --      Pain Score 08/04/19 1335 10     Pain Loc --      Pain Edu? --      Excl. in GC? --    Constitutional: Alert and oriented. Well appearing and in no acute  distress.  Moderate distress and crying. Mouth/Throat: Mucous membranes are moist.  Oropharynx non-erythematous.  Patient cannot open mouth fully for exam. Neck: No stridor.   Hematological/Lymphatic/Immunilogical: No cervical lymphadenopathy. Cardiovascular: Normal rate, regular rhythm. Grossly normal heart sounds.  Good peripheral circulation. Respiratory: Normal respiratory effort.  No retractions. Lungs CTAB. ____________________________________________   LABS (all labs ordered are listed, but only abnormal results are displayed)  Labs Reviewed - No data to display ____________________________________________  EKG   ____________________________________________  RADIOLOGY  ED MD interpretation:    Official radiology report(s): No  results found.  ____________________________________________   PROCEDURES  Procedure(s) performed (including Critical Care):  Procedures   ____________________________________________   INITIAL IMPRESSION / ASSESSMENT AND PLAN / ED COURSE  As part of my medical decision making, I reviewed the following data within the [facility]     Patient presents with dental pain secondary to suspected impacted wisdom tooth.  Patient was advised to go to [facility] for extraction but decided to come to our emergency room for pain medication and extraction.  Advised patient we will give her a dose of Percocet and viscous lidocaine so she may proceed to the dental clinic of her choice.   Rachel Stevenson was evaluated in Emergency Department on 08/04/2019 for the symptoms described in the history of present illness. She was evaluated in the context of the global COVID-19 pandemic, which necessitated consideration that the patient might be at risk for infection with the SARS-CoV-2 virus that causes COVID-19. Institutional protocols and algorithms that pertain to the evaluation of patients at risk for COVID-19 are in a state of rapid change based on information released by regulatory bodies including the CDC and federal and state organizations. These policies and algorithms were followed during the patient's care in the ED.       ____________________________________________   FINAL CLINICAL IMPRESSION(S) / ED DIAGNOSES  Final diagnoses:  Pain, dental     ED Discharge Orders    None       Note:  This document was prepared using Dragon voice recognition software and may include unintentional dictation errors.    Sable Feil, PA-C 08/04/19 1501    Duffy Bruce, MD 08/05/19 1236

## 2019-12-15 ENCOUNTER — Telehealth: Payer: Self-pay | Admitting: Family Medicine

## 2019-12-15 NOTE — Telephone Encounter (Signed)
Call to patient, no answer. LMOM to return call.  Robie Oats, RN  

## 2019-12-15 NOTE — Telephone Encounter (Signed)
PT IS REQUESTING A CALL FOR STD RESULTS.

## 2019-12-16 ENCOUNTER — Telehealth: Payer: Self-pay | Admitting: Family Medicine

## 2019-12-16 NOTE — Telephone Encounter (Signed)
Call to patient, no answer. LMOM to return call.  Dinnis Rog, RN  

## 2019-12-16 NOTE — Telephone Encounter (Signed)
Pt missed a phone call from [place]. Returning call.

## 2019-12-17 ENCOUNTER — Ambulatory Visit: Payer: Medicaid Other

## 2019-12-17 NOTE — Telephone Encounter (Signed)
Call to patient. No answer. LMOM to reutrn call.   Harvie Heck, RN

## 2019-12-25 NOTE — Telephone Encounter (Signed)
Duplicate Entry

## 2020-03-17 ENCOUNTER — Encounter: Payer: Self-pay | Admitting: Emergency Medicine

## 2020-06-02 IMAGING — DX DG ANKLE COMPLETE 3+V*R*
3 series · 3 of 3 positions shown · non-contrast
Comparison: None.

CLINICAL DATA: Pain following twisting injury

EXAM:
RIGHT ANKLE - COMPLETE 3+ VIEW

[ankle ap]
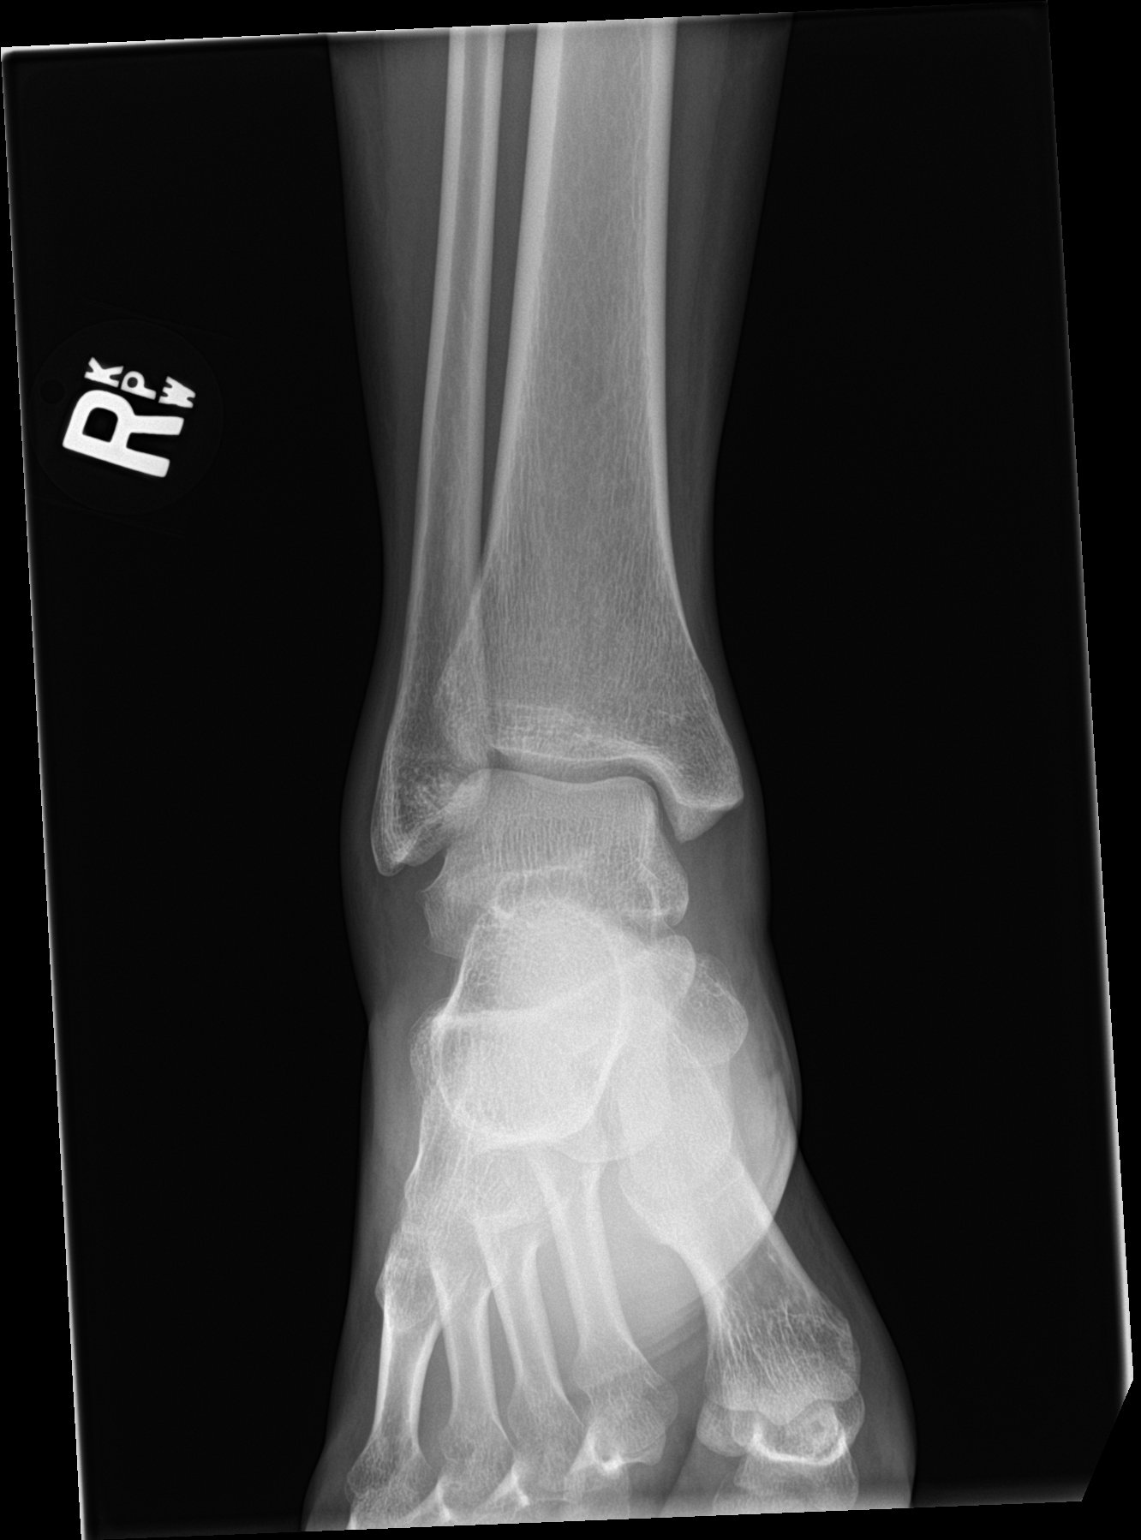

[ankle obl]
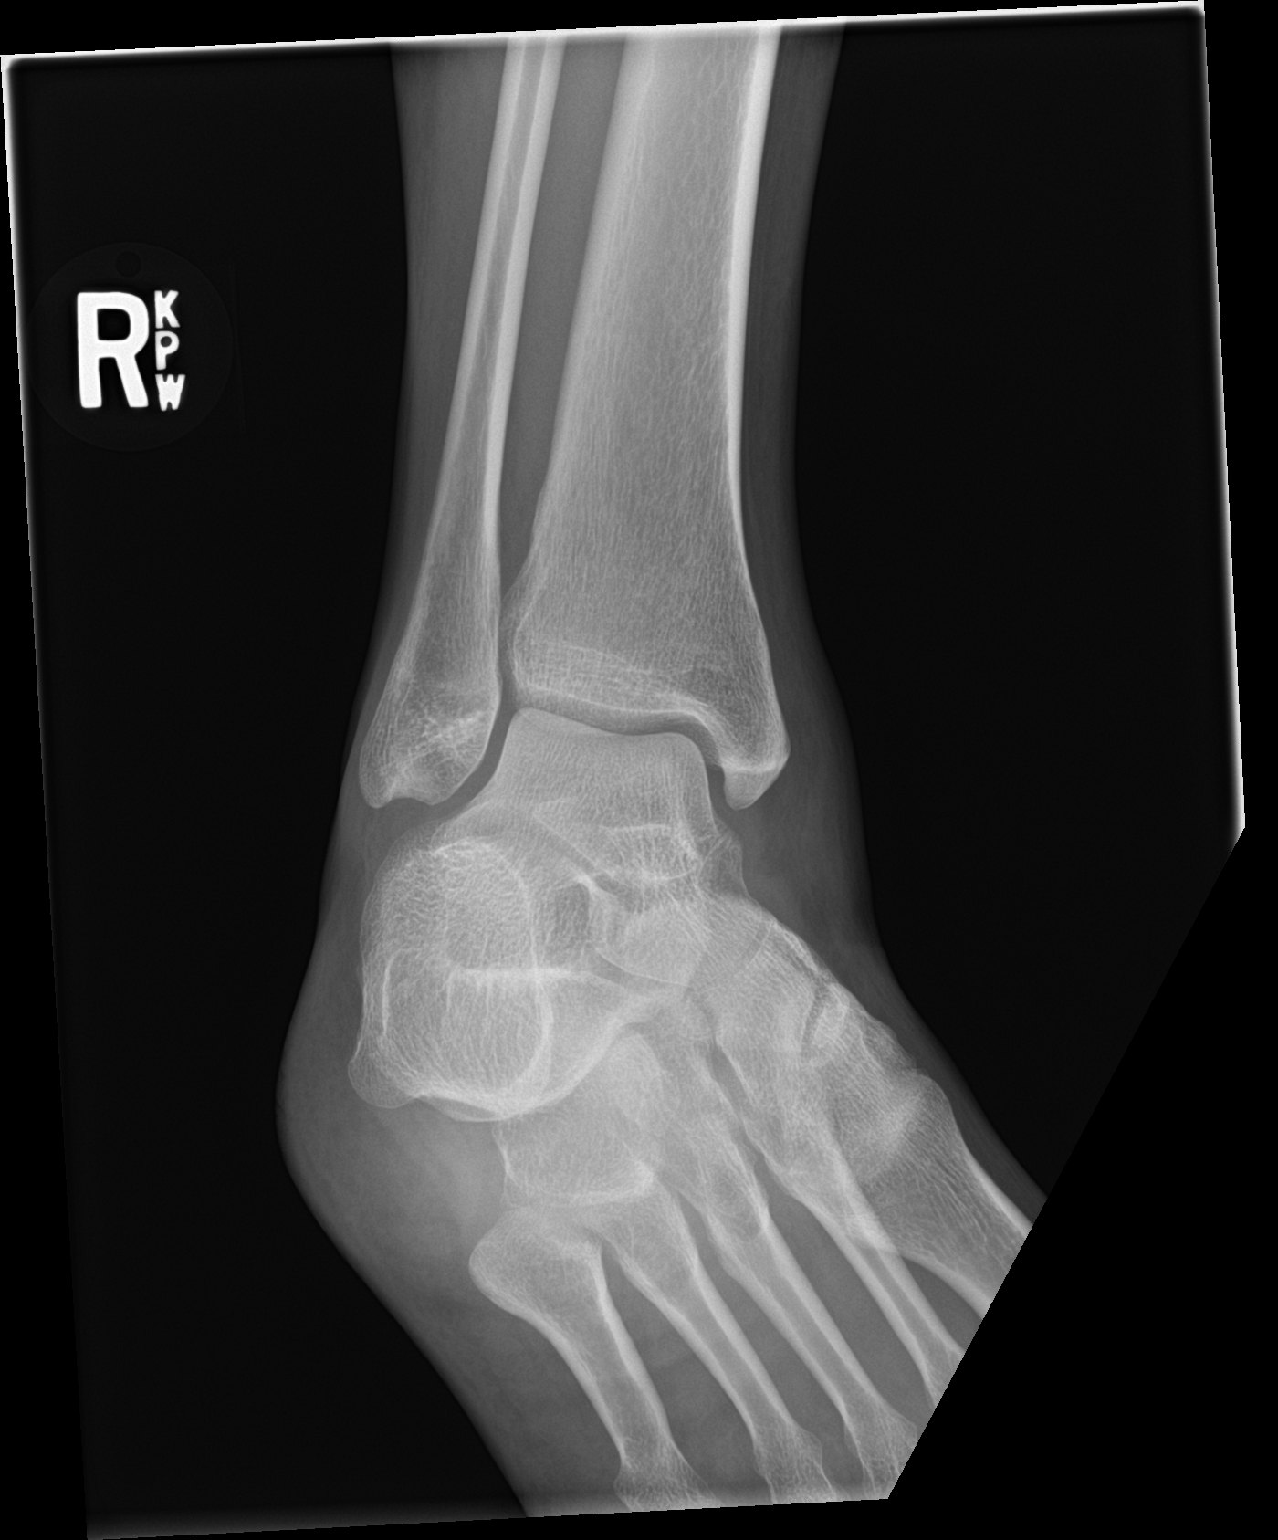

[ankle lat]
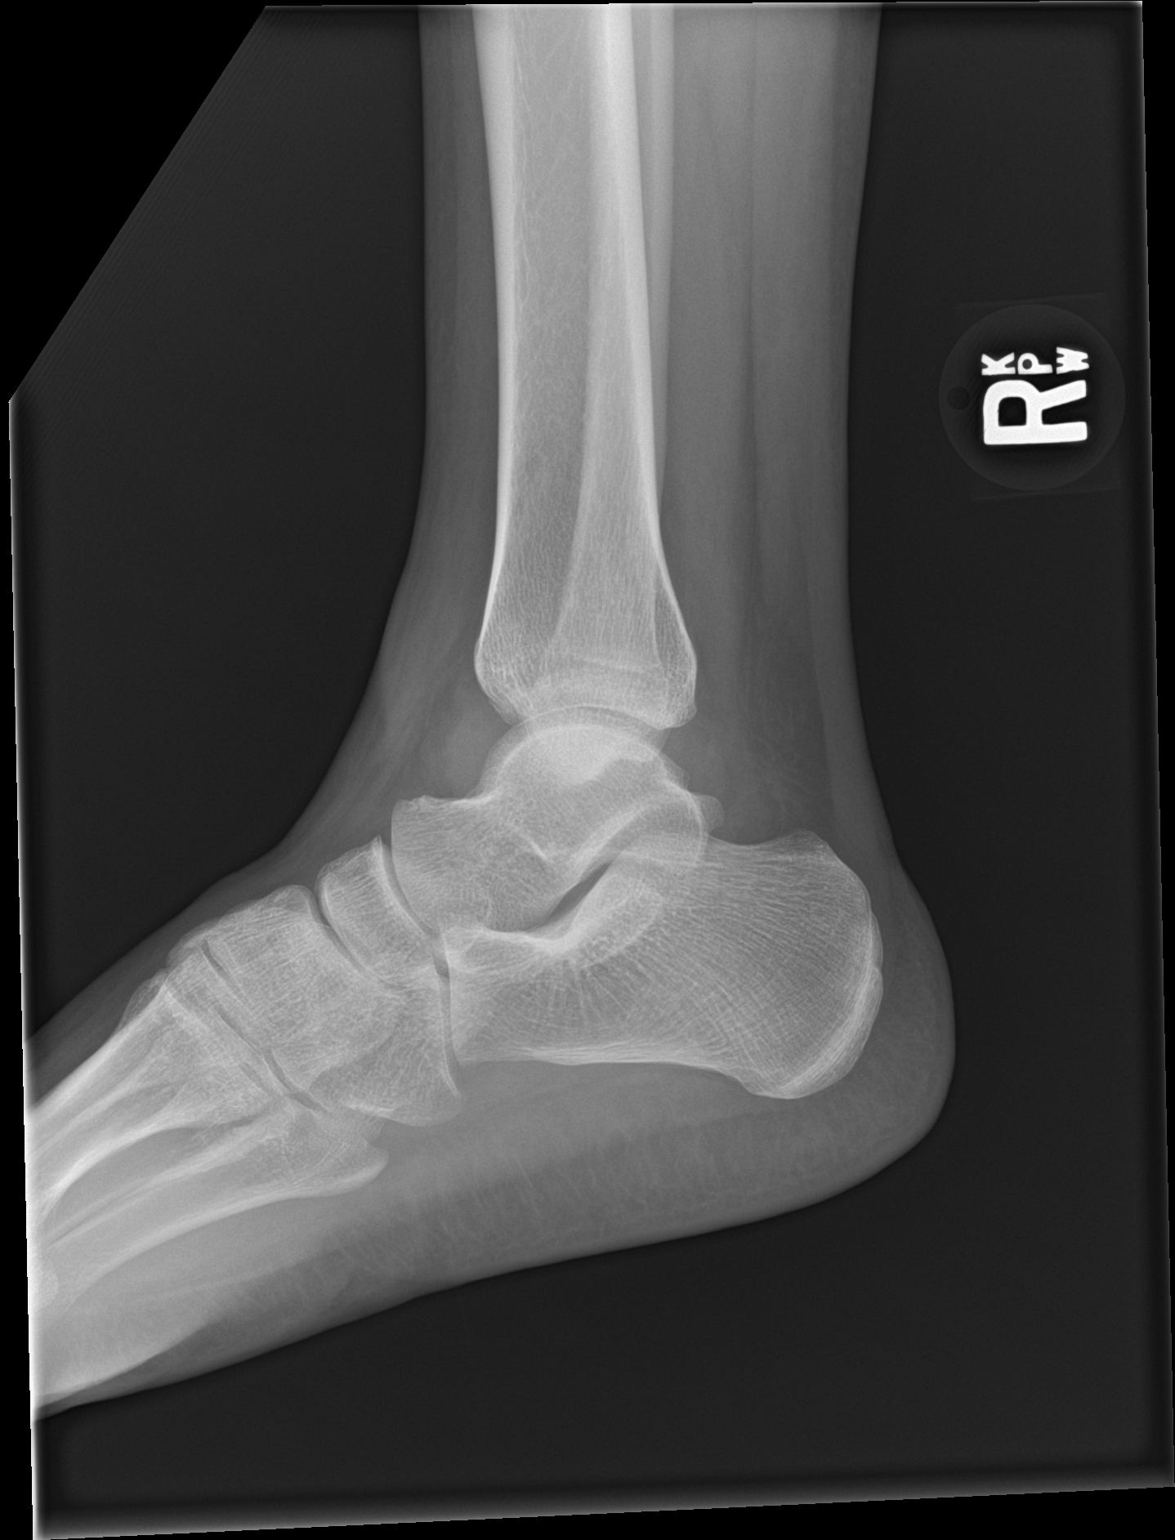

[3 of 3 positions shown; findings below may reference images not displayed]

FINDINGS: Frontal, oblique, and lateral views were obtained. There is mild
soft tissue swelling laterally. There is no evident fracture. There
is a focal joint effusion. There is no appreciable joint space
narrowing or erosion. The ankle mortise appears intact.
IMPRESSION: Soft tissue swelling laterally with joint effusion. Suspect
underlying ligamentous injury. No fracture evident. No appreciable
arthropathy. Ankle mortise appears intact.

## 2020-12-21 ENCOUNTER — Telehealth: Payer: Medicaid Other | Admitting: Physician Assistant

## 2020-12-21 DIAGNOSIS — B3731 Acute candidiasis of vulva and vagina: Secondary | ICD-10-CM

## 2020-12-21 MED ORDER — FLUCONAZOLE 150 MG PO TABS: 150.0000 mg | ORAL_TABLET | Freq: Once | ORAL | 0 refills | Status: AC

## 2020-12-21 NOTE — Progress Notes (Signed)

## 2020-12-21 NOTE — Progress Notes (Signed)
I have spent 5 minutes in review of e-visit questionnaire, review and updating patient chart, medical decision making and response to patient.   Rachel Stevenson Rachel Kline Bulthuis, PA-C
